# Patient Record
Sex: Male | Born: 1995 | Race: White | Hispanic: No | Marital: Single | Smoking: Former smoker
Health system: Southern US, Community
[De-identification: ages and names within clinical notes are randomized; demographics above are authoritative.]

## PROBLEM LIST (undated history)

## (undated) DIAGNOSIS — T7840XA Allergy, unspecified, initial encounter: Secondary | ICD-10-CM

## (undated) DIAGNOSIS — J45909 Unspecified asthma, uncomplicated: Secondary | ICD-10-CM

## (undated) HISTORY — DX: Allergy, unspecified, initial encounter: T78.40XA

---

## 2014-12-03 ENCOUNTER — Ambulatory Visit: Admit: 2014-12-03 | Disposition: A | Discharge status: Home / Self Care | Payer: Self-pay | Admitting: Family Medicine

## 2015-05-24 DIAGNOSIS — J029 Acute pharyngitis, unspecified: Secondary | ICD-10-CM | POA: Diagnosis not present

## 2015-09-02 ENCOUNTER — Other Ambulatory Visit: Payer: Self-pay | Admitting: Family Medicine

## 2015-09-02 ENCOUNTER — Ambulatory Visit
Admission: RE | Admit: 2015-09-02 | Discharge: 2015-09-02 | Disposition: A | Discharge status: Home / Self Care | Payer: Managed Care, Other (non HMO) | Source: Ambulatory Visit | Attending: Family Medicine | Admitting: Family Medicine

## 2015-09-02 DIAGNOSIS — W2103XA Struck by baseball, initial encounter: Secondary | ICD-10-CM | POA: Diagnosis not present

## 2015-09-02 DIAGNOSIS — W2100XA Struck by hit or thrown ball, unspecified type, initial encounter: Secondary | ICD-10-CM

## 2015-09-02 DIAGNOSIS — S99291A Other physeal fracture of phalanx of right toe, initial encounter for closed fracture: Secondary | ICD-10-CM | POA: Diagnosis not present

## 2015-09-02 DIAGNOSIS — S99921A Unspecified injury of right foot, initial encounter: Secondary | ICD-10-CM

## 2015-09-02 DIAGNOSIS — S99929A Unspecified injury of unspecified foot, initial encounter: Secondary | ICD-10-CM | POA: Diagnosis not present

## 2015-09-02 DIAGNOSIS — Y9364 Activity, baseball: Secondary | ICD-10-CM | POA: Insufficient documentation

## 2015-10-28 ENCOUNTER — Encounter: Payer: Self-pay | Admitting: Family Medicine

## 2015-10-28 ENCOUNTER — Ambulatory Visit (INDEPENDENT_AMBULATORY_CARE_PROVIDER_SITE_OTHER): Payer: Managed Care, Other (non HMO) | Admitting: Family Medicine

## 2015-10-28 VITALS — BP 119/58 | HR 81 | Temp 97.7°F | Resp 16

## 2015-10-28 DIAGNOSIS — B353 Tinea pedis: Secondary | ICD-10-CM

## 2015-10-28 DIAGNOSIS — R059 Cough, unspecified: Secondary | ICD-10-CM

## 2015-10-28 DIAGNOSIS — R05 Cough: Secondary | ICD-10-CM

## 2015-10-28 NOTE — Progress Notes (Signed)
Patient ID: Jeffrey West, male   DOB: April 30, 1996, 20 y.o.   MRN: [id number]  Patient presents with symptoms of right foot fungal infection. He has been trying some ointment the trainer has given him but doesn't seem to help. Has had this in the past as well. Describes it as being itchy and only on the right foot. Also has had a cough for 2 weeks that he thinks is getting better but not gone. Has been taking his oral antihistamine for seasonal allergies. Has hx of asthma as a child and doesn't use Albuterol anymore. No worsening of symptoms with exercise. Denies fever, CP, SOB< wheezing, night sweats, weight loss. Admits cough is not productive.   ROS: Negative except mentioned above.  Vitals as per Epic.  GENERAL: NAD HEENT: no pharyngeal erythema, no exudate, no erythema of TMs, no cervical LAD RESP: CTA B CARD: RRR SKIN: R Foot- erythematous scaly slightly raised rash on dorsum aspect of 4th and 5th webspace, some cracking of the skin but no drainage, no tenderness, no streaks  NEURO: CN II-XII grossly intact   A/P: 1)R Tinea Pedis-will try Lotrisone for 1-2 weeks, keep area dry and clean, if any further problems will f/u.   2)Cough-appears to be allergy related, recommend continuing oral antihistamine and adding Flonase, if cough still persistent or worse after one week will f/u and will order CXR.

## 2015-11-15 ENCOUNTER — Encounter: Payer: Self-pay | Admitting: Family Medicine

## 2015-11-15 ENCOUNTER — Ambulatory Visit (INDEPENDENT_AMBULATORY_CARE_PROVIDER_SITE_OTHER): Payer: Managed Care, Other (non HMO) | Admitting: Family Medicine

## 2015-11-15 VITALS — BP 132/69 | HR 103 | Temp 97.8°F | Resp 16

## 2015-11-15 DIAGNOSIS — J209 Acute bronchitis, unspecified: Secondary | ICD-10-CM | POA: Diagnosis not present

## 2015-11-15 MED ORDER — AZITHROMYCIN 250 MG PO TABS: ORAL_TABLET | ORAL | Status: AC

## 2015-11-15 NOTE — Progress Notes (Signed)
Patient ID: Jeffrey West, male   DOB: 01/01/1996, 20 y.o.   MRN: [id number] Patient presents today with mild productive cough for about a month. Patient has been taking Zyrtec daily. He denies any fever, chills, weight loss. Has some nasal drainage. He admits to having asthma as a child. He thinks the cough is worse at night. He denies any sore throat, ear pain, severe headache, nausea, vomiting, abdominal pain, diarrhea, wheezing, shortness of breath, chest pain, lower extremity swelling or pain. Physical activity does not make the symptoms worse.  ROS: Negative except mentioned above.  GENERAL: NAD HEENT: no pharyngeal erythema, no exudate, no erythema of TMs, no cervical LAD RESP: CTA B CARD: RRR (86 HR) NEURO: CN II-XII grossly intact   A/P: Bronchitis- will get CXR, treat with Z-pk, continue Zyrtec, activity as tolerated, seek medical attention if symptoms persist or worsen as discussed.

## 2015-11-17 ENCOUNTER — Other Ambulatory Visit: Payer: Self-pay | Admitting: Family Medicine

## 2015-11-17 ENCOUNTER — Ambulatory Visit
Admission: RE | Admit: 2015-11-17 | Discharge: 2015-11-17 | Disposition: A | Discharge status: Home / Self Care | Payer: Managed Care, Other (non HMO) | Source: Ambulatory Visit | Attending: Family Medicine | Admitting: Family Medicine

## 2015-11-17 DIAGNOSIS — R05 Cough: Secondary | ICD-10-CM

## 2015-11-17 DIAGNOSIS — R058 Other specified cough: Secondary | ICD-10-CM

## 2016-05-22 ENCOUNTER — Encounter: Payer: Self-pay | Admitting: Emergency Medicine

## 2016-05-22 ENCOUNTER — Emergency Department
Admission: EM | Admit: 2016-05-22 | Discharge: 2016-05-22 | Disposition: A | Discharge status: Home / Self Care | Payer: Managed Care, Other (non HMO) | Attending: Student | Admitting: Student

## 2016-05-22 ENCOUNTER — Emergency Department: Payer: Managed Care, Other (non HMO)

## 2016-05-22 DIAGNOSIS — R1031 Right lower quadrant pain: Secondary | ICD-10-CM

## 2016-05-22 DIAGNOSIS — J45909 Unspecified asthma, uncomplicated: Secondary | ICD-10-CM | POA: Diagnosis not present

## 2016-05-22 DIAGNOSIS — Z87891 Personal history of nicotine dependence: Secondary | ICD-10-CM | POA: Diagnosis not present

## 2016-05-22 DIAGNOSIS — I880 Nonspecific mesenteric lymphadenitis: Secondary | ICD-10-CM | POA: Insufficient documentation

## 2016-05-22 HISTORY — DX: Unspecified asthma, uncomplicated: J45.909

## 2016-05-22 LAB — COMPREHENSIVE METABOLIC PANEL
ALK PHOS: 71 U/L (ref 38–126)
ALT: 25 U/L (ref 17–63)
ANION GAP: 5 (ref 5–15)
AST: 30 U/L (ref 15–41)
Albumin: 4.5 g/dL (ref 3.5–5.0)
BUN: 13 mg/dL (ref 6–20)
CHLORIDE: 103 mmol/L (ref 101–111)
CO2: 29 mmol/L (ref 22–32)
Calcium: 9.4 mg/dL (ref 8.9–10.3)
Creatinine, Ser: 0.99 mg/dL (ref 0.61–1.24)
GFR calc Af Amer: >60 mL/min (ref >60)
GFR calc non Af Amer: >60 mL/min (ref >60)
GLUCOSE: 89 mg/dL (ref 65–99)
POTASSIUM: 3.7 mmol/L (ref 3.5–5.1)
SODIUM: 137 mmol/L (ref 135–145)
TOTAL PROTEIN: 7.9 g/dL (ref 6.5–8.1)
Total Bilirubin: 0.6 mg/dL (ref 0.3–1.2)

## 2016-05-22 LAB — CBC WITH DIFFERENTIAL/PLATELET
BASOS ABS: 0 10*3/uL (ref 0–0.1)
Basophils Relative: 0 %
Eosinophils Absolute: 0.2 10*3/uL (ref 0–0.7)
Eosinophils Relative: 3 %
HCT: 43.8 % (ref 40.0–52.0)
HEMOGLOBIN: 15.2 g/dL (ref 13.0–18.0)
Lymphocytes Relative: 16 %
Lymphs Abs: 1.2 10*3/uL (ref 1.0–3.6)
MCH: 30.5 pg (ref 26.0–34.0)
MCHC: 34.7 g/dL (ref 32.0–36.0)
MCV: 87.8 fL (ref 80.0–100.0)
Monocytes Absolute: 0.7 10*3/uL (ref 0.2–1.0)
Monocytes Relative: 10 %
NEUTROS ABS: 5.3 10*3/uL (ref 1.4–6.5)
Neutrophils Relative %: 71 %
Platelets: 153 10*3/uL (ref 150–440)
RBC: 4.99 MIL/uL (ref 4.40–5.90)
RDW: 13.2 % (ref 11.5–14.5)
WBC: 7.5 10*3/uL (ref 3.8–10.6)

## 2016-05-22 LAB — URINALYSIS COMPLETE WITH MICROSCOPIC (ARMC ONLY)
BILIRUBIN URINE: NEGATIVE
Bacteria, UA: NONE SEEN
GLUCOSE, UA: NEGATIVE mg/dL
HGB URINE DIPSTICK: NEGATIVE
Ketones, ur: NEGATIVE mg/dL
LEUKOCYTES UA: NEGATIVE
NITRITE: NEGATIVE
Protein, ur: NEGATIVE mg/dL
SPECIFIC GRAVITY, URINE: 1.014 (ref 1.005–1.030)
Squamous Epithelial / LPF: NONE SEEN
pH: 8 (ref 5.0–8.0)

## 2016-05-22 LAB — LIPASE, BLOOD: LIPASE: 24 U/L (ref 11–51)

## 2016-05-22 MED ORDER — IOPAMIDOL (ISOVUE-300) INJECTION 61%
100.0000 mL | Freq: Once | INTRAVENOUS | Status: AC | PRN
  Administered 2016-05-22: 100 mL via INTRAVENOUS

## 2016-05-22 MED ORDER — IOPAMIDOL (ISOVUE-300) INJECTION 61%: 30.0000 mL | Freq: Once | INTRAVENOUS | Status: DC

## 2016-05-22 MED ORDER — IBUPROFEN 600 MG PO TABS
600.0000 mg | ORAL_TABLET | Freq: Four times a day (QID) | ORAL | 0 refills | Status: AC | PRN
Start: 2016-05-22 — End: ?

## 2016-05-22 MED ORDER — SODIUM CHLORIDE 0.9 % IV BOLUS (SEPSIS)
1000.0000 mL | Freq: Once | INTRAVENOUS | Status: AC
  Administered 2016-05-22: 1000 mL via INTRAVENOUS

## 2016-05-22 NOTE — ED Notes (Signed)
Pt ambulatory to restroom to collect urine sample.

## 2016-05-22 NOTE — Discharge Instructions (Signed)
You were seen in the emergency department for right lower abdominal pain, and your appendix was normal on your CT scan but there were some inflamed lymph nodes suggesting mesenteric adenitis which is the most likely cause of your  pain. This can be seen in the setting of a viral illness which you may have given that you have had diarrhea. Please follow-up with your doctor soon as possible regarding today's visit. Return immediately to the emergency department if you develop severe or worsening abdominal pain, fever, vomiting, blood in vomit or stool, inability to have a bowel movement, chest pain, difficulty breathing, or for any other concerns.

## 2016-05-22 NOTE — ED Notes (Signed)
Pt finished drinking oral contrast, CT notified.  

## 2016-05-22 NOTE — ED Provider Notes (Signed)
[facility] Emergency Department Provider Note   ____________________________________________   First MD Initiated Contact with Patient 05/22/16 1206     (approximate)  I have reviewed the triage vital signs and the nursing notes.   HISTORY  Chief Complaint Abdominal Pain    HPI Jeffrey West is a 20 y.o. male with no chronic medical problems presents for evaluation of right lower quadrant pain, diarrhea, subjective fever and chills since last night, gradual onset, constant, moderate, no modifying factors. Nausea or vomiting. No blood in his stool. No pain or burning with urination, no testicular pain.   Past Medical History:  Diagnosis Date  . Allergy   . Asthma     There are no active problems to display for this patient.   History reviewed. No pertinent surgical history.  Prior to Admission medications   Medication Sig Start Date End Date Taking? Authorizing Provider  azithromycin (ZITHROMAX Z-PAK) 250 MG tablet Use as directed for 5 days. 11/15/15   Jolene Provost, MD  cetirizine (ZYRTEC) 5 MG tablet Take 5 mg by mouth daily. Reported on 11/15/2015    Historical Provider, MD    Allergies Amoxapine and related  No family history on file.  Social History Social History  Substance Use Topics  . Smoking status: Former Games developer  . Smokeless tobacco: Never Used  . Alcohol use 1.8 oz/week    3 Cans of beer per week    Review of Systems Constitutional: + fever/chills Eyes: No visual changes. ENT: No sore throat. Cardiovascular: Denies chest pain. Respiratory: Denies shortness of breath. Gastrointestinal: + abdominal pain.  No nausea, no vomiting.  +diarrhea.  No constipation. Genitourinary: Negative for dysuria. Musculoskeletal: Negative for back pain. Skin: Negative for rash. Neurological: Negative for headaches, focal weakness or numbness.  10-point ROS otherwise negative.  ____________________________________________   PHYSICAL  EXAM:  VITAL SIGNS: ED Triage Vitals  Enc Vitals Group     BP 05/22/16 1125 129/79     Pulse Rate 05/22/16 1125 99     Resp 05/22/16 1125 20     Temp 05/22/16 1125 98.5 F (36.9 C)     Temp Source 05/22/16 1125 Oral     SpO2 05/22/16 1125 98 %     Weight 05/22/16 1125 240 lb (108.9 kg)     Height 05/22/16 1125 6\' 2"  (1.88 m)     Head Circumference --      Peak Flow --      Pain Score 05/22/16 1126 6     Pain Loc --      Pain Edu? --      Excl. in GC? --     Constitutional: Alert and oriented. Well appearing and in no acute distress. Eyes: Conjunctivae are normal. PERRL. EOMI. Head: Atraumatic. Nose: No congestion/rhinnorhea. Mouth/Throat: Mucous membranes are moist.  Oropharynx non-erythematous. Neck: No stridor.  Cardiovascular: Normal rate, regular rhythm. Grossly normal heart sounds.  Good peripheral circulation. Respiratory: Normal respiratory effort.  No retractions. Lungs CTAB. Gastrointestinal: Soft With faint tenderness to palpation in the right lower quadrants, no rebound or guarding.Marland Kitchen No CVA tenderness. Genitourinary: Deferred Musculoskeletal: No lower extremity tenderness nor edema.  No joint effusions. Neurologic:  Normal speech and language. No gross focal neurologic deficits are appreciated. No gait instability. Skin:  Skin is warm, dry and intact. No rash noted. Psychiatric: Mood and affect are normal. Speech and behavior are normal.  ____________________________________________   LABS (all labs ordered are listed, but only abnormal results are displayed)  Labs Reviewed  URINALYSIS COMPLETEWITH MICROSCOPIC ([facility] ONLY) - Abnormal; Notable for the following:       Result Value   Color, Urine YELLOW (*)    APPearance CLEAR (*)    All other components within normal limits  CBC WITH DIFFERENTIAL/PLATELET  COMPREHENSIVE METABOLIC PANEL  LIPASE, BLOOD    ____________________________________________  EKG  none ____________________________________________  RADIOLOGY  CT abdomen and pelvis IMPRESSION:  1. No acute abdominal/pelvic findings, mass lesions or adenopathy.  2. No renal or ureteral calculi.  3. Scattered mesenteric lymph nodes could suggest mesenteric  adenitis. No CT findings for acute appendicitis.      ____________________________________________   PROCEDURES  Procedure(s) performed: None  Procedures  Critical Care performed: No  ____________________________________________   INITIAL IMPRESSION / ASSESSMENT AND PLAN / ED COURSE  Pertinent labs & imaging results that were available during my care of the patient were reviewed by me and considered in my medical decision making (see chart for details).  Jeffrey West is a 20 y.o. male with no chronic medical problems presents for evaluation of right lower quadrant pain, diarrhea, chills since last night. On exam, he is very well-appearing and in no acute distress. Vital signs stable and he is afebrile. He has faint tenderness to palpation in the right lower quadrant. I reviewed his labs, CBC, CMP and lipase unremarkable. Urinalysis consistent with infection. We discussed that he is afebrile without leukocytosis and only mildly tender on examination. We discussed that at this point, appendicitis cannot be ruled out without CT of the abdomen and pelvis. I offered CT here today versus discharge with watchful waiting and return for worsening symptoms. We discussed risks and benefits of CT and he wants to proceed with CT scan today so I have ordered it. Medication for pain saying his pain is well controlled.  ----------------------------------------- 2:07 PM on 05/22/2016 ----------------------------------------- Patient continues to rest comfortably. CT of the abdomen and pelvis shows a normal appendix, findings consistent with mesenteric adenitis is noted which is the  most likely cause of his right lower quadrant pain at this time. We discussed this, we discussed return precautions and need for close PCP follow-up and he is comfortable with the discharge plan. DC home.  Clinical Course     ____________________________________________   FINAL CLINICAL IMPRESSION(S) / ED DIAGNOSES  Final diagnoses:  RLQ abdominal pain  Acute mesenteric adenitis      NEW MEDICATIONS STARTED DURING THIS VISIT:  New Prescriptions   No medications on file     Note:  This document was prepared using Dragon voice recognition software and may include unintentional dictation errors.    Gayla DossEryka A Rykin Route, MD 05/22/16 [phone]

## 2016-05-22 NOTE — ED Triage Notes (Signed)
States he woke up with chills and right side pain.  Denies n/v/d or other sx.

## 2016-05-22 NOTE — ED Notes (Signed)
Patient transported to CT 

## 2016-07-03 ENCOUNTER — Ambulatory Visit: Payer: Managed Care, Other (non HMO) | Admitting: Family Medicine

## 2016-12-04 ENCOUNTER — Other Ambulatory Visit: Payer: Self-pay | Admitting: Family Medicine

## 2016-12-04 ENCOUNTER — Ambulatory Visit
Admission: RE | Admit: 2016-12-04 | Discharge: 2016-12-04 | Disposition: A | Discharge status: Home / Self Care | Payer: 59 | Source: Ambulatory Visit | Attending: Family Medicine | Admitting: Family Medicine

## 2016-12-04 DIAGNOSIS — W2103XA Struck by baseball, initial encounter: Secondary | ICD-10-CM | POA: Insufficient documentation

## 2016-12-04 DIAGNOSIS — Y9364 Activity, baseball: Secondary | ICD-10-CM | POA: Insufficient documentation

## 2016-12-04 DIAGNOSIS — R52 Pain, unspecified: Secondary | ICD-10-CM

## 2016-12-04 DIAGNOSIS — S6992XA Unspecified injury of left wrist, hand and finger(s), initial encounter: Secondary | ICD-10-CM | POA: Insufficient documentation

## 2016-12-14 ENCOUNTER — Other Ambulatory Visit: Payer: Self-pay | Admitting: Family Medicine

## 2016-12-14 MED ORDER — DICLOFENAC SODIUM 75 MG PO TBEC: 75.0000 mg | DELAYED_RELEASE_TABLET | Freq: Two times a day (BID) | ORAL | 0 refills | Status: AC

## 2017-03-24 IMAGING — CT CT ABD-PELV W/ CM
2 of 4 series · 15 of 46 positions shown, 17 images · IV contrast (APPLIED)
Comparison: None.

CLINICAL DATA: Woke up this morning with chills and right lower
quadrant pain.

EXAM:
CT ABDOMEN AND PELVIS WITH CONTRAST
TECHNIQUE: Multidetector CT imaging of the abdomen and pelvis was performed
using the standard protocol following bolus administration of
intravenous contrast.
CONTRAST:  100mL MEZX61-WOO IOPAMIDOL (MEZX61-WOO) INJECTION 61%

[Series 2: axial st · axial · 0.76mm/px · z∈[-485,+25]mm · 12 of 112 slices shown, 14 images]
[im 5/112  soft-tissue]
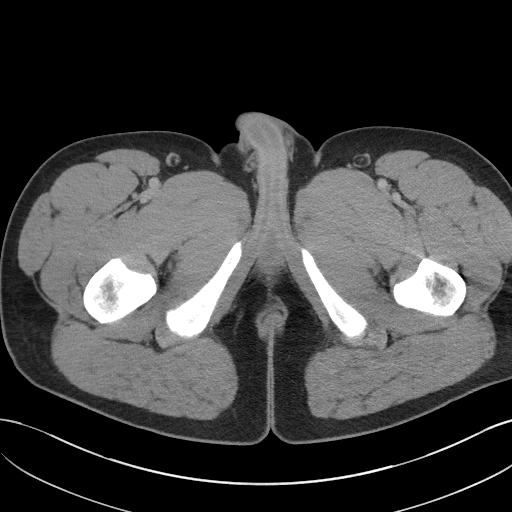
[im 5/112  bone]
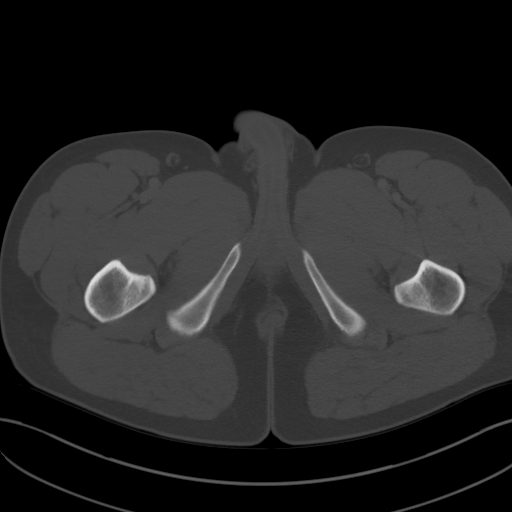
[im 14/112  soft-tissue]
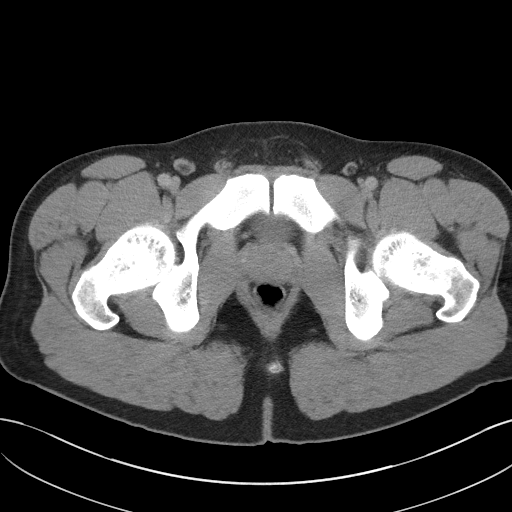
[im 24/112  soft-tissue]
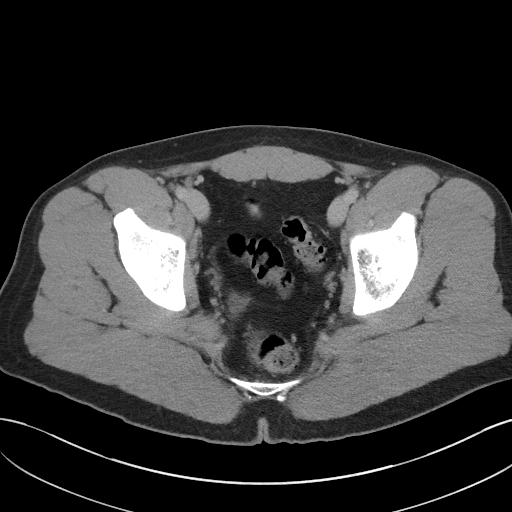
[im 33/112  soft-tissue]
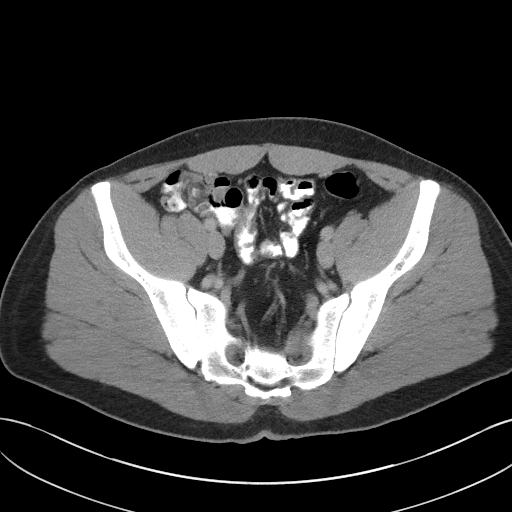
[im 42/112  soft-tissue]
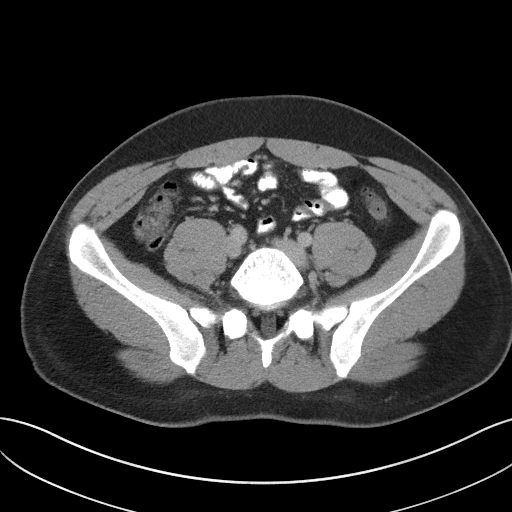
[im 51/112  soft-tissue]
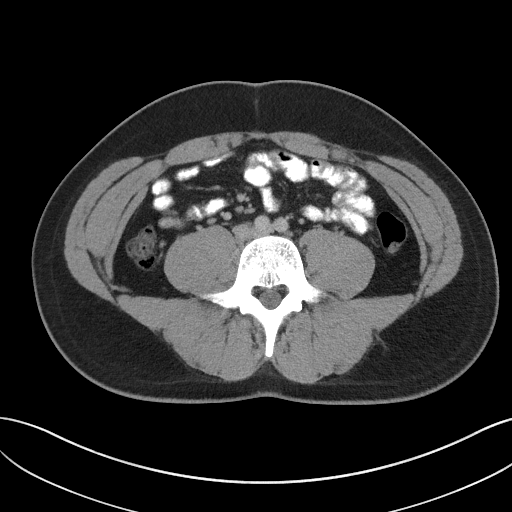
[im 61/112  soft-tissue]
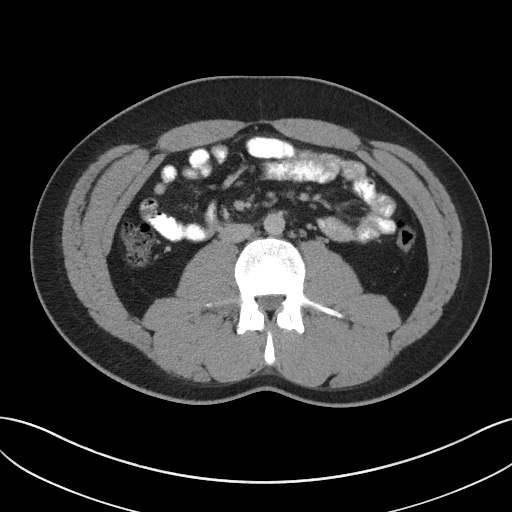
[im 70/112  soft-tissue]
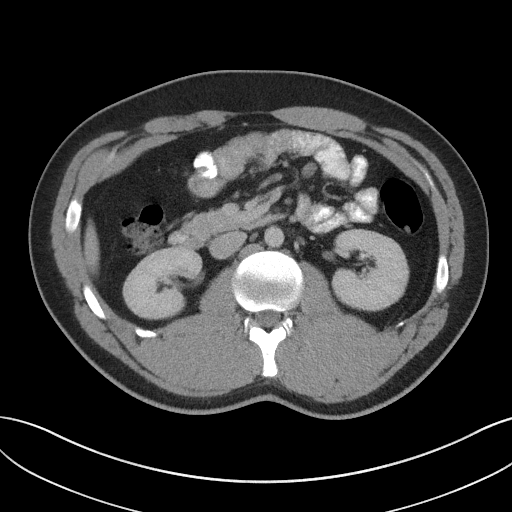
[im 79/112  soft-tissue]
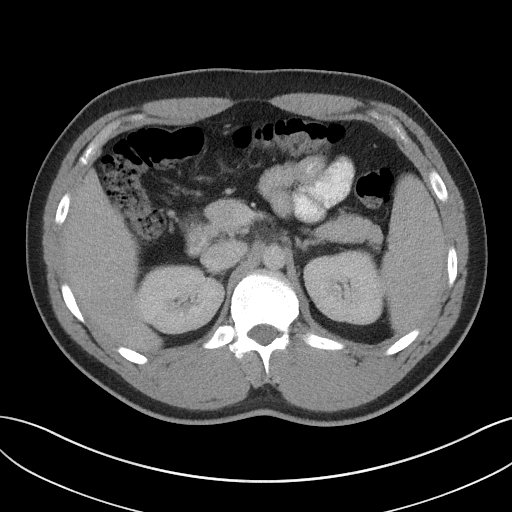
[im 79/112  bone]
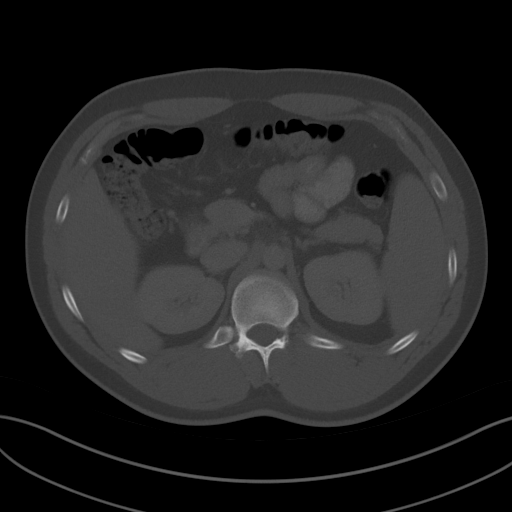
[im 88/112  soft-tissue]
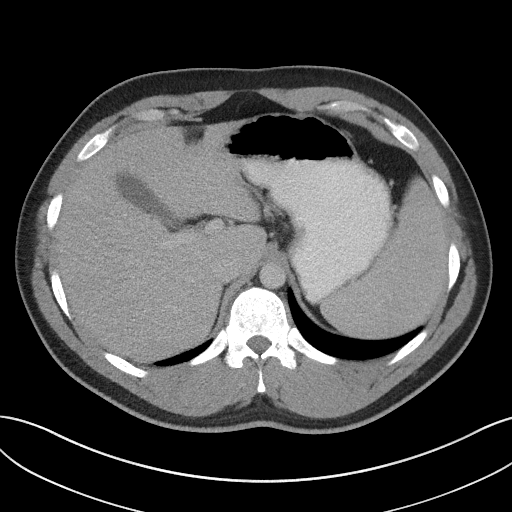
[im 98/112  soft-tissue]
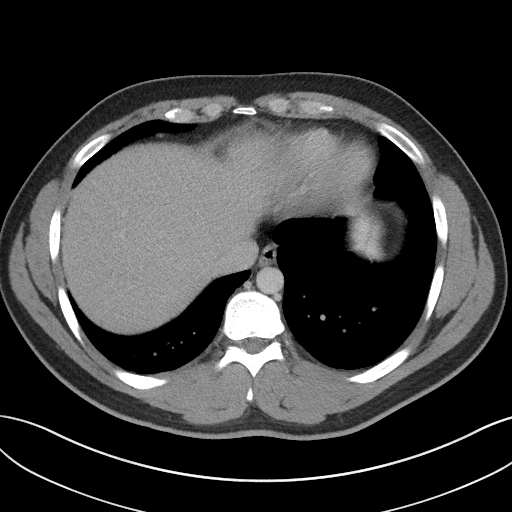
[im 107/112  soft-tissue]
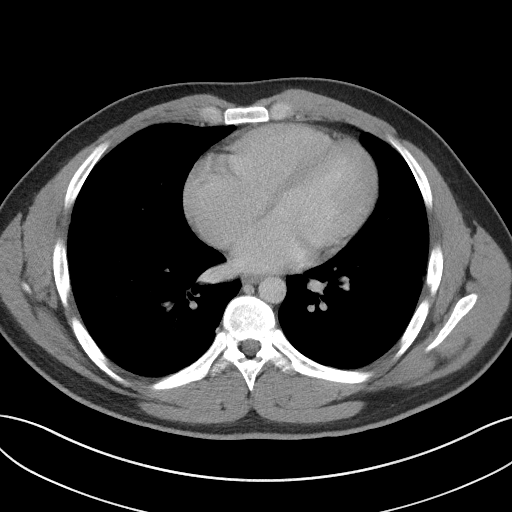

[Series 5: coronal st · coronal · 0.76mm/px · 3 of 80 slices shown]
[im 27/80  soft-tissue]
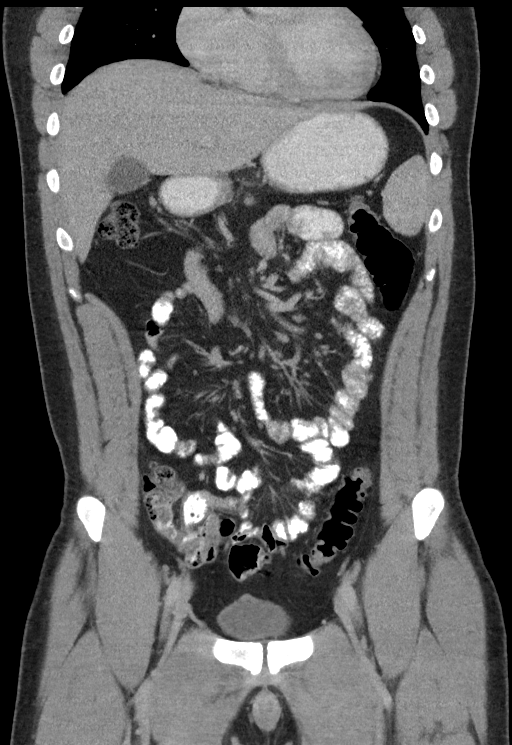
[im 36/80  soft-tissue]
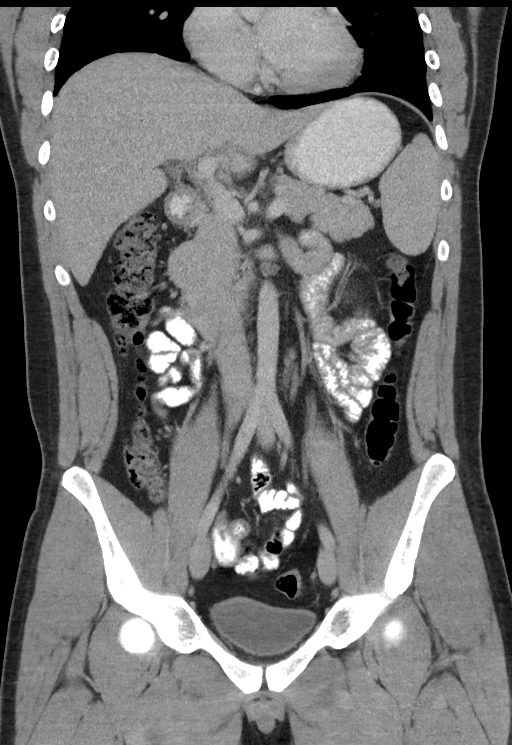
[im 44/80  soft-tissue]
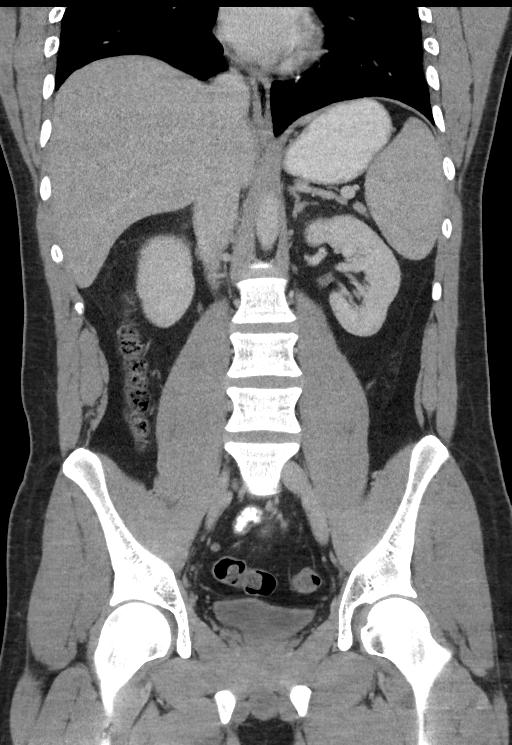

[15 of 46 positions shown; findings below may reference images not displayed]

FINDINGS: Lower chest: The lung bases are clear of acute process. No pleural
effusion or pulmonary lesions. The heart is normal in size. No
pericardial effusion. The distal esophagus and aorta are
unremarkable.

Hepatobiliary: No focal hepatic lesions or intrahepatic biliary
dilatation. The gallbladder is normal. No common bile duct
dilatation.

Pancreas: No mass, inflammation or ductal dilatation.

Spleen: Normal size.  No focal lesions.

Adrenals/Urinary Tract: The adrenal glands and kidneys are normal.

Stomach/Bowel: The stomach, duodenum, small bowel and colon are
unremarkable. No inflammatory changes, mass lesions or obstructive
findings. The terminal ileum is normal. The appendix is normal.

Vascular/Lymphatic: The aorta and branch vessels are normal. The
major venous structures are normal. Small scattered mesenteric and
retroperitoneal lymph nodes but no mass or overt adenopathy.
Possible mesenteric adenitis.

Reproductive: The prostate gland and seminal vesicles are
unremarkable.

Other: No pelvic mass or adenopathy. No free pelvic fluid
collections. No abdominal wall hernia or subcutaneous lesions.

Musculoskeletal: No significant bony findings.
IMPRESSION: 1. No acute abdominal/pelvic findings, mass lesions or adenopathy.
2. No renal or ureteral calculi.
3. Scattered mesenteric lymph nodes could suggest mesenteric
adenitis. No CT findings for acute appendicitis.

## 2017-10-06 IMAGING — CR DG WRIST COMPLETE 3+V*L*
1 series · 4 of 4 positions shown · non-contrast
Comparison: None.

CLINICAL DATA: Hit with baseball yesterday.

EXAM:
LEFT WRIST - COMPLETE 3+ VIEW

[Series 1: dg wrist complete left · 0.14mm/px · 4 of 4 slices shown]
[im 1/4]
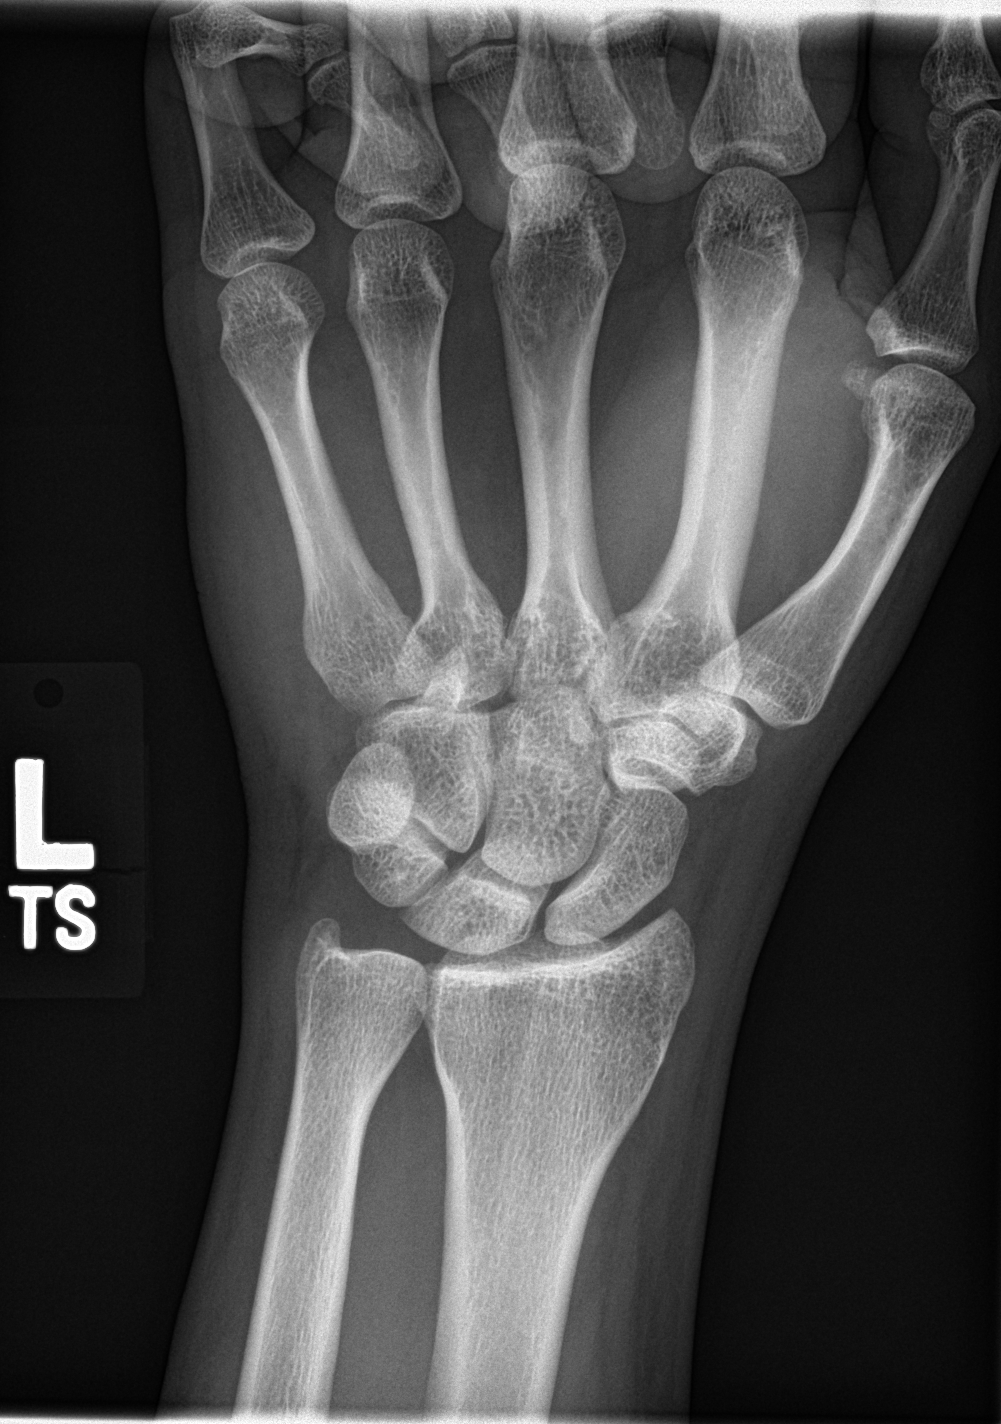
[im 2/4]
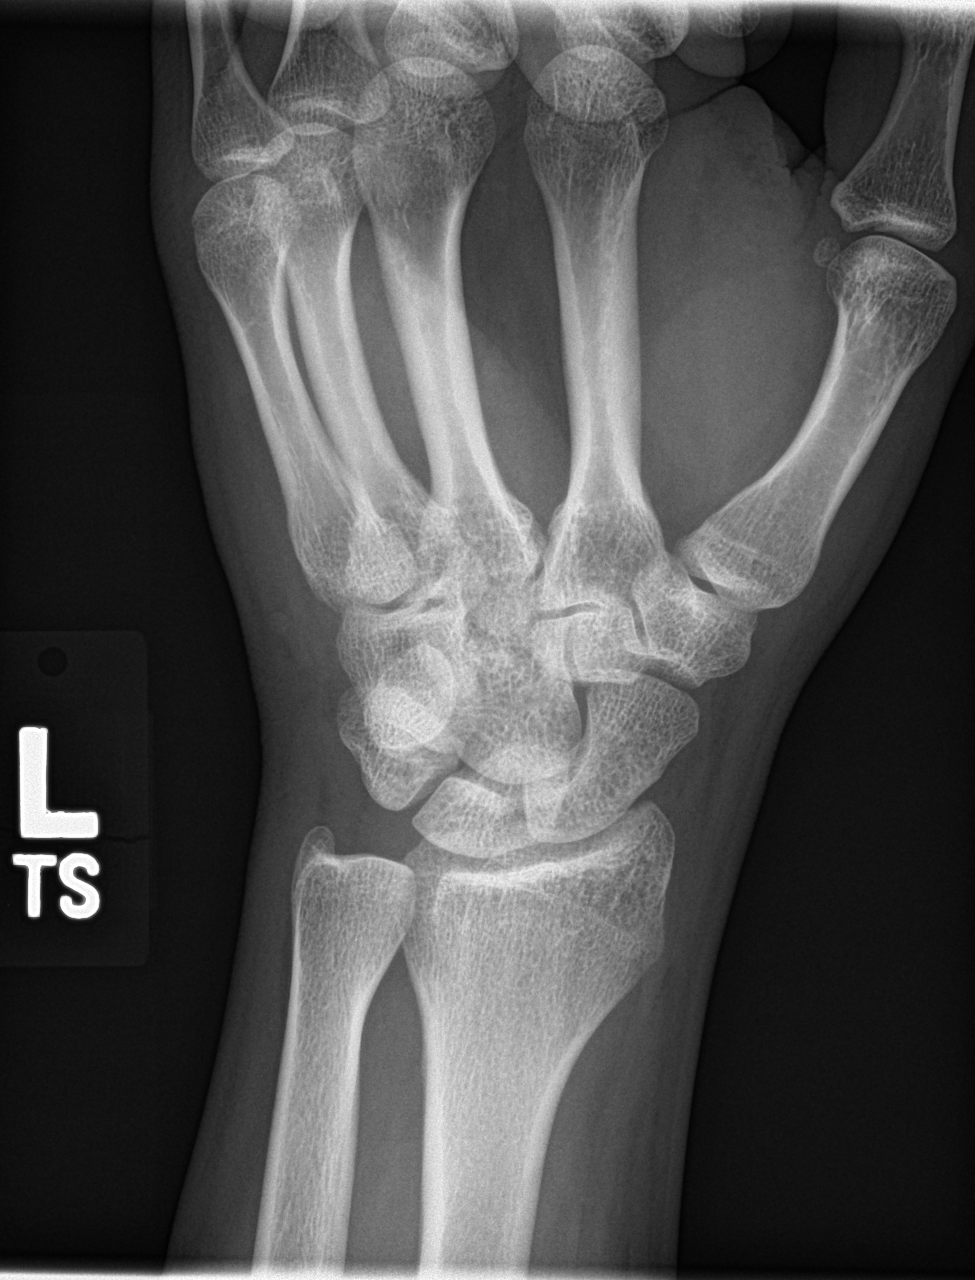
[im 3/4]
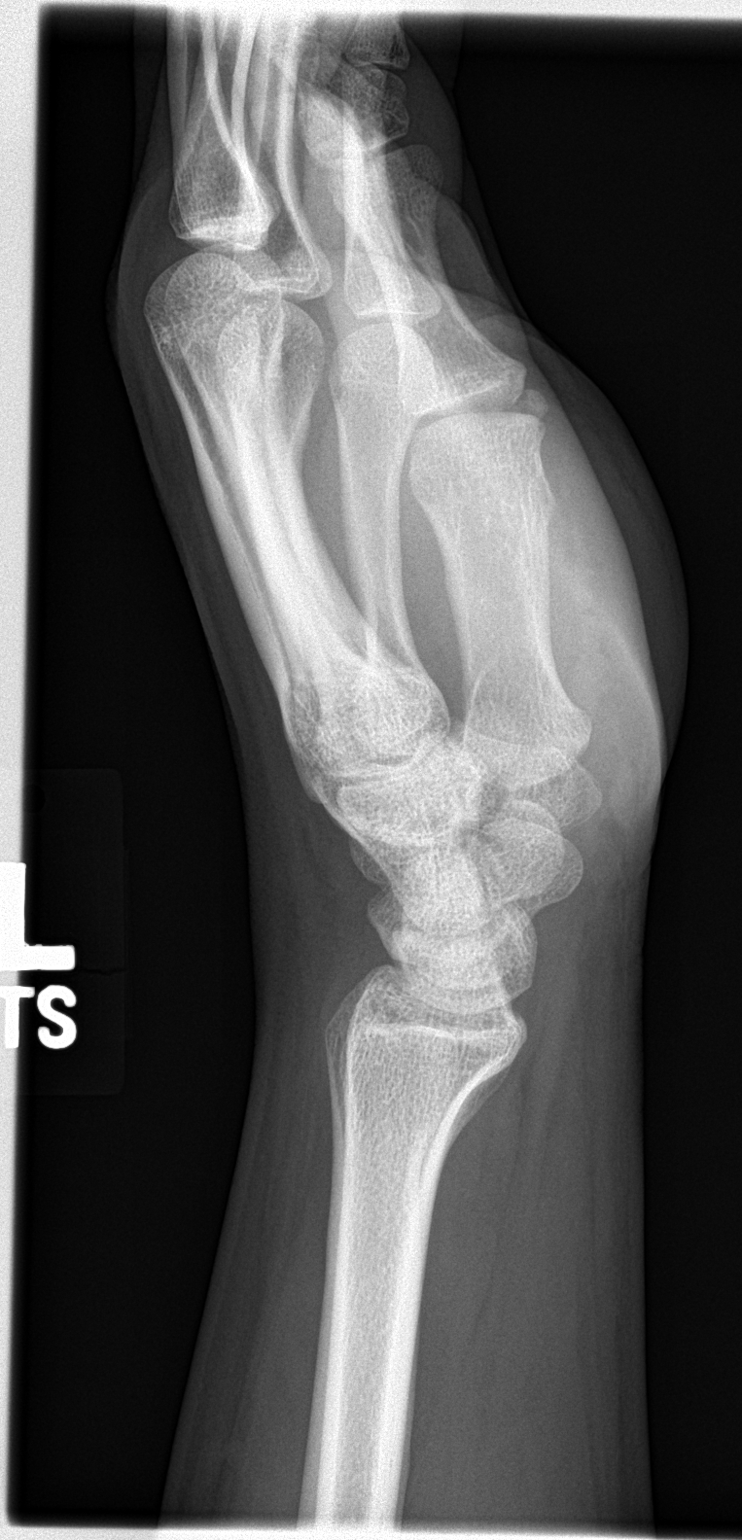
[im 4/4]
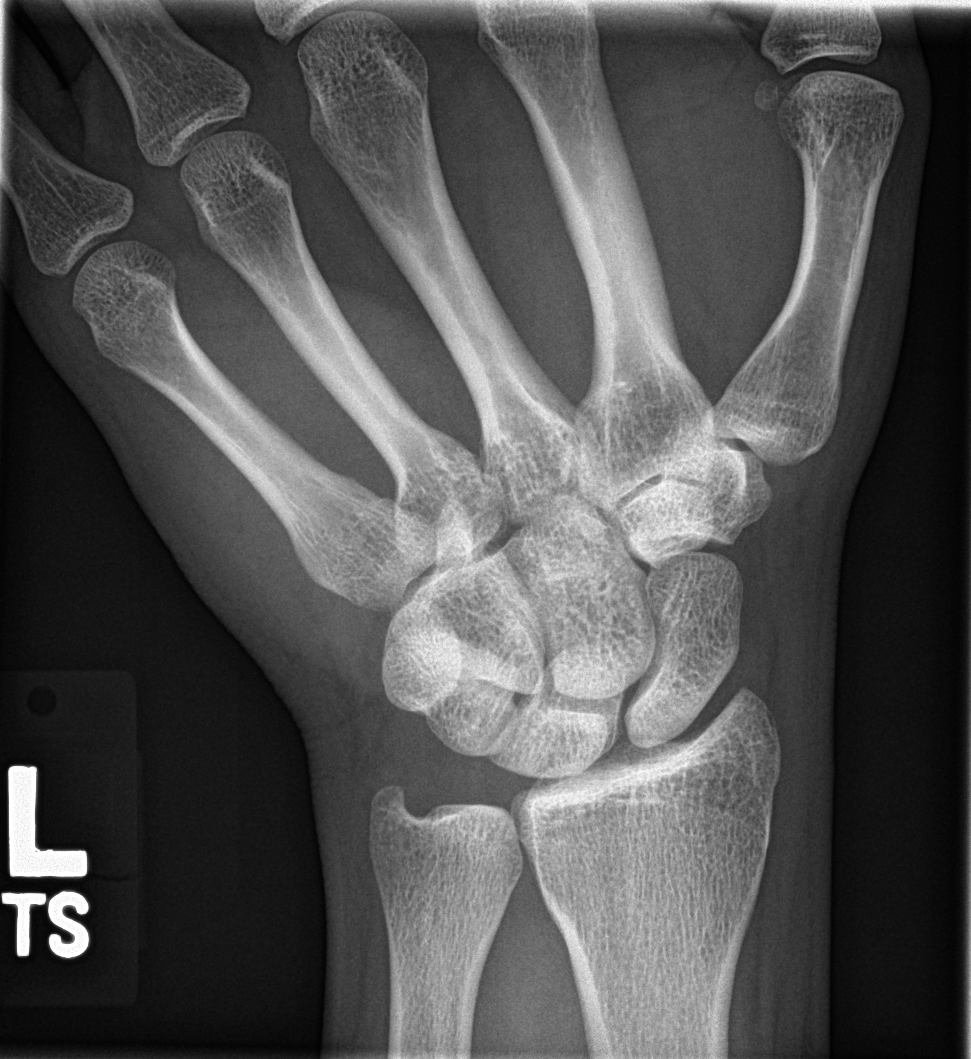

[4 of 4 positions shown; findings below may reference images not displayed]

FINDINGS: There is no evidence of fracture or dislocation. There is no
evidence of arthropathy or other focal bone abnormality. Soft
tissues are unremarkable.
IMPRESSION: Negative.
